# Patient Record
Sex: Male | Born: 1955 | Race: White | Hispanic: No | Marital: Married | Smoking: Never smoker
Health system: Southern US, Community
[De-identification: ages and names within clinical notes are randomized; demographics above are authoritative.]

## PROBLEM LIST (undated history)

## (undated) DIAGNOSIS — E119 Type 2 diabetes mellitus without complications: Secondary | ICD-10-CM

## (undated) DIAGNOSIS — I10 Essential (primary) hypertension: Secondary | ICD-10-CM

---

## 1998-08-08 ENCOUNTER — Emergency Department (HOSPITAL_COMMUNITY): Admission: EM | Admit: 1998-08-08 | Discharge: 1998-08-08 | Payer: Self-pay | Admitting: Emergency Medicine

## 2015-04-24 ENCOUNTER — Emergency Department (HOSPITAL_COMMUNITY): Payer: Worker's Compensation

## 2015-04-24 ENCOUNTER — Encounter (HOSPITAL_COMMUNITY): Payer: Self-pay | Admitting: Emergency Medicine

## 2015-04-24 ENCOUNTER — Emergency Department (HOSPITAL_COMMUNITY)
Admission: EM | Admit: 2015-04-24 | Discharge: 2015-04-24 | Disposition: A | Discharge status: Home / Self Care | Payer: Worker's Compensation | Attending: Emergency Medicine | Admitting: Emergency Medicine

## 2015-04-24 ENCOUNTER — Emergency Department (HOSPITAL_COMMUNITY)
Admission: EM | Admit: 2015-04-24 | Discharge: 2015-04-24 | Disposition: A | Discharge status: Home / Self Care | Payer: Worker's Compensation | Source: Home / Self Care | Attending: Emergency Medicine | Admitting: Emergency Medicine

## 2015-04-24 DIAGNOSIS — S61412A Laceration without foreign body of left hand, initial encounter: Secondary | ICD-10-CM | POA: Insufficient documentation

## 2015-04-24 DIAGNOSIS — Y929 Unspecified place or not applicable: Secondary | ICD-10-CM | POA: Diagnosis not present

## 2015-04-24 DIAGNOSIS — I10 Essential (primary) hypertension: Secondary | ICD-10-CM | POA: Insufficient documentation

## 2015-04-24 DIAGNOSIS — Y9389 Activity, other specified: Secondary | ICD-10-CM | POA: Insufficient documentation

## 2015-04-24 DIAGNOSIS — W260XXA Contact with knife, initial encounter: Secondary | ICD-10-CM | POA: Diagnosis not present

## 2015-04-24 DIAGNOSIS — Y999 Unspecified external cause status: Secondary | ICD-10-CM | POA: Insufficient documentation

## 2015-04-24 DIAGNOSIS — W260XXD Contact with knife, subsequent encounter: Secondary | ICD-10-CM | POA: Insufficient documentation

## 2015-04-24 DIAGNOSIS — T148XXA Other injury of unspecified body region, initial encounter: Secondary | ICD-10-CM

## 2015-04-24 DIAGNOSIS — S61412D Laceration without foreign body of left hand, subsequent encounter: Secondary | ICD-10-CM | POA: Insufficient documentation

## 2015-04-24 DIAGNOSIS — S6992XA Unspecified injury of left wrist, hand and finger(s), initial encounter: Secondary | ICD-10-CM | POA: Diagnosis present

## 2015-04-24 DIAGNOSIS — IMO0002 Reserved for concepts with insufficient information to code with codable children: Secondary | ICD-10-CM

## 2015-04-24 DIAGNOSIS — S60222D Contusion of left hand, subsequent encounter: Secondary | ICD-10-CM | POA: Insufficient documentation

## 2015-04-24 DIAGNOSIS — E119 Type 2 diabetes mellitus without complications: Secondary | ICD-10-CM | POA: Insufficient documentation

## 2015-04-24 HISTORY — DX: Essential (primary) hypertension: I10

## 2015-04-24 HISTORY — DX: Type 2 diabetes mellitus without complications: E11.9

## 2015-04-24 LAB — CBG MONITORING, ED: Glucose-Capillary: 119 mg/dL — ABNORMAL HIGH (ref 65–99)

## 2015-04-24 MED ORDER — BACITRACIN ZINC 500 UNIT/GM EX OINT
TOPICAL_OINTMENT | Freq: Once | CUTANEOUS | Status: AC
  Administered 2015-04-24: 1 via TOPICAL

## 2015-04-24 MED ORDER — BACITRACIN ZINC 500 UNIT/GM EX OINT: 1.0000 "application " | TOPICAL_OINTMENT | Freq: Two times a day (BID) | CUTANEOUS | Status: DC

## 2015-04-24 MED ORDER — LIDOCAINE-EPINEPHRINE (PF) 2 %-1:200000 IJ SOLN
10.0000 mL | Freq: Once | INTRAMUSCULAR | Status: AC
  Administered 2015-04-24: 10 mL

## 2015-04-24 NOTE — ED Notes (Signed)
Patient is out of the department for testing; visitor waiting in room

## 2015-04-24 NOTE — ED Notes (Signed)
CBG 119 
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
## 2015-04-24 NOTE — ED Notes (Signed)
LAB in for UDS.

## 2015-04-24 NOTE — ED Notes (Signed)
"  I feel much better now".

## 2015-04-24 NOTE — ED Provider Notes (Signed)
S: Eddie Burke is a 59 y.o. male presents to the ED with worsening swelling to the left hand after laceration early this morning. Patient was seen initially in the emergency department with 3 sutures placed to the left hand.  After returning to work he noticed swelling and a large amount of bleeding from the site. He presents today with continued swelling but no further bleeding.  O:  General: Awake  HEENT: Atraumatic  Resp: Normal effort  Cardiac: RRR, capillary refill less than 3 seconds in all fingers; fingers are warm to touch Abd: Nondistended, soft  Neuro:No focal weakness, and grip strength, sensation intact in all fingers throughout the radial, ulnar and medial nerve distributions  Skin: Laceration well sutured. Small amount of blood oozing from the laceration site. Moderate swelling and ecchymosis today a lateral left hand running along the dorsum and thenar eminence with palpable fluctuance, no significant pain to palpation   A/P:  Pt with large hematoma underneath the laceration. Suspect nicking of small vessel. Compression dressing placed and ice given. Patient evaluated by Dr. Adela Lank who agrees with treatment plan.  BP 145/89 mmHg  Pulse 84  Temp(Src) 98.1 F (36.7 C) (Oral)  Resp 20  Ht  (1.778 m)  Wt 278 lb (126.1 kg)  BMI 39.89 kg/m2  SpO2 98%   Pt was seen by Alveta Heimlich, PA-C and personally evaluated by myself and  Melene Plan, DO supervising.      Dahlia Client Haward Pope, PA-C 04/24/15 1233  Melene Plan, DO 04/24/15 1533

## 2015-04-24 NOTE — ED Provider Notes (Signed)
Medical screening examination/treatment/procedure(s) were conducted as a shared visit with non-physician practitioner(s) and myself.  I personally evaluated the patient during the encounter.   EKG Interpretation None       See the written copy of this report in the patient's paper medical record.  These results did not interface directly into the electronic medical record and are summarized here.  59 year old male with a chief complaint of a laceration to the left hand. This was seen earlier today and sutured. Patient having swelling over the course of the day noticed some gushing of blood from the wound. On exam patient with a hematoma just underneath the skin. Some surrounding edema. Not currently bleeding. Pulse motor and sensation is intact. No signs of ischemia. Will have the patient follow with his PCP. Discussed wound care.  Melene Plan, DO 04/24/15 1533

## 2015-04-24 NOTE — ED Notes (Signed)
Patient had syncopal episode while sitting in room.   Patient laid back and came to quickly.   VS bp 134/88, hr 89, SPO2 99% room air.

## 2015-04-24 NOTE — Discharge Instructions (Signed)
-   Rest, ice and elevate hand - Return to ED in 7 days for suture removal

## 2015-04-24 NOTE — ED Notes (Signed)
Dr. Adela Lank in to see pt. Left hand rewrapped with pressure dressing. Pt feeling well.

## 2015-04-24 NOTE — ED Notes (Signed)
Pt given crackers and beverage. 

## 2015-04-24 NOTE — ED Notes (Signed)
Pt discharged this am with left hand injury. Returns now for bleeding through dressing.

## 2015-04-24 NOTE — ED Notes (Signed)
Pt reports he was cutting something when the knife slipped striking his left hand. Pt has laceration to the left hand. Pt denies blood thinners. Pt alert x4.

## 2015-04-24 NOTE — ED Notes (Signed)
Suture cart bedside. 

## 2015-04-24 NOTE — Discharge Instructions (Signed)

## 2015-04-24 NOTE — ED Notes (Signed)
Lab called for [facility] drug screen.

## 2015-04-24 NOTE — ED Provider Notes (Signed)
CSN: [id number]     Arrival date & time 04/24/15  4098 History   First MD Initiated Contact with Patient 04/24/15 [phone]     Chief Complaint  Patient presents with  . Extremity Laceration     (Consider location/radiation/quality/duration/timing/severity/associated sxs/prior Treatment) HPI Eddie Burke is a 59 y.o. male with a history of DM and HTN, comes in for evaluation of laceration. Patient states at approximately 6:30 am he was trying to cut a roll of tape when he accidentally stabbed the top of his left hand with his pocket knife. He immediately washed out the wound and came to ED for evaluation. He denies numbness, weakness, tingling or decreased ROM. Reports pain as 2/10, worse with certain movements. Last tetanus was August 2015. Denies blood thinners. No other medical complaints. No other aggravating or modifying factors.  Past Medical History  Diagnosis Date  . Hypertension   . Diabetes mellitus without complication    No past surgical history on file. No family history on file. Social History  Substance Use Topics  . Smoking status: Never Smoker   . Smokeless tobacco: None  . Alcohol Use: Yes     Comment: social    Review of Systems A 10 point review of systems was completed and was negative except for pertinent positives and negatives as mentioned in the history of present illness     Allergies  Review of patient's allergies indicates no known allergies.  Home Medications   Prior to Admission medications   Not on File   BP 149/79 mmHg  Pulse 97  Temp(Src) 98.7 F (37.1 C) (Oral)  Resp 18  SpO2 98% Physical Exam  Constitutional:  Awake, alert, nontoxic appearance.  HENT:  Head: Atraumatic.  Eyes: Right eye exhibits no discharge. Left eye exhibits no discharge.  Neck: Neck supple.  Pulmonary/Chest: Effort normal. He exhibits no tenderness.  Abdominal: Soft. There is no tenderness. There is no rebound.  Musculoskeletal: He exhibits no tenderness.   Baseline ROM, no obvious new focal weakness.  Neurological:  Mental status and motor strength appears baseline for patient and situation.  Skin: No rash noted.  Small, 2cm linear lac noted to dorsum of L hand between thumb and index finger. Able to flex and extend all digits against resistance w/o difficulty or pain.  Psychiatric: He has a normal mood and affect.  Nursing note and vitals reviewed.   ED Course  Procedures (including critical care time) LACERATION REPAIR Performed by: Sharlene Motts Authorized by: Sharlene Motts Consent: Verbal consent obtained. Risks and benefits: risks, benefits and alternatives were discussed Consent given by: patient Patient identity confirmed: provided demographic data Prepped and Draped in normal sterile fashion Wound explored  Laceration Location: L hand  Laceration Length: 2cm  No Foreign Bodies seen or palpated  Anesthesia: local infiltration  Local anesthetic: lidocaine 2% w epinephrine  Anesthetic total: 3 ml  Irrigation method: syringe Amount of cleaning: standard  Skin closure: 4-0 Prolene  Number of sutures: 3  Technique: SI  Patient tolerance: Patient tolerated the procedure well with no immediate complications.  Labs Review Labs Reviewed - No data to display  Imaging Review No results found. I have personally reviewed and evaluated these images and lab results as part of my medical decision-making.   EKG Interpretation None     Meds given in ED:  Medications  bacitracin ointment 1 application (not administered)  lidocaine-EPINEPHrine (XYLOCAINE W/EPI) 2 %-1:200000 (PF) injection 10 mL (10 mLs Infiltration Given by Other  04/24/15 0901)  bacitracin ointment (1 application Topical Given 04/24/15 0932)    New Prescriptions   No medications on file   Filed Vitals:   04/24/15 0710  BP: 149/79  Pulse: 97  Temp: 98.7 F (37.1 C)  TempSrc: Oral  Resp: 18  SpO2: 98%    MDM  Vitals stable -  WNL -afebrile Pt resting comfortably in ED. PE--NVI, FROM Labwork--Tetanus updated Aug 2015 Imaging--Xray L hand neg for FB/other acute process Lac repair by myself at bedside, tolerated well. Will see in 10 days for suture removal. Topical abx and dressing applied with hygiene instructions given. I discussed all relevant lab findings and imaging results with pt and they verbalized understanding. Discussed f/u with PCP within 48 hrs and return precautions, pt very amenable to plan.  Final diagnoses:  Laceration        Joycie Peek, PA-C 04/24/15 1610  Arby Barrette, MD 05/09/15 [phone]
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
## 2021-12-09 ENCOUNTER — Ambulatory Visit: Payer: Self-pay | Admitting: Nurse Practitioner

## 2021-12-25 ENCOUNTER — Ambulatory Visit: Payer: Self-pay | Admitting: Nurse Practitioner

## 2022-02-04 ENCOUNTER — Telehealth: Payer: Self-pay | Admitting: Nurse Practitioner

## 2022-02-04 NOTE — Telephone Encounter (Signed)
Closed referral. Faxed PCP
